# Patient Record
Sex: Female | Born: 1975
Health system: Southern US, Community
[De-identification: ages and names within clinical notes are randomized; demographics above are authoritative.]

## PROBLEM LIST (undated history)

## (undated) DIAGNOSIS — N920 Excessive and frequent menstruation with regular cycle: Secondary | ICD-10-CM

---

## 1998-10-30 ENCOUNTER — Other Ambulatory Visit: Admission: RE | Admit: 1998-10-30 | Discharge: 1998-10-30 | Payer: Self-pay | Admitting: *Deleted

## 1999-11-13 ENCOUNTER — Other Ambulatory Visit: Admission: RE | Admit: 1999-11-13 | Discharge: 1999-11-13 | Payer: Self-pay | Admitting: *Deleted

## 2002-02-22 ENCOUNTER — Other Ambulatory Visit: Admission: RE | Admit: 2002-02-22 | Discharge: 2002-02-22 | Payer: Self-pay | Admitting: Obstetrics and Gynecology

## 2003-03-14 ENCOUNTER — Other Ambulatory Visit: Admission: RE | Admit: 2003-03-14 | Discharge: 2003-03-14 | Payer: Self-pay | Admitting: Obstetrics and Gynecology

## 2003-05-31 ENCOUNTER — Ambulatory Visit (HOSPITAL_COMMUNITY): Admission: RE | Admit: 2003-05-31 | Discharge: 2003-05-31 | Payer: Self-pay | Admitting: Obstetrics and Gynecology

## 2003-10-23 ENCOUNTER — Other Ambulatory Visit: Admission: RE | Admit: 2003-10-23 | Discharge: 2003-10-23 | Payer: Self-pay | Admitting: Obstetrics and Gynecology

## 2003-12-15 ENCOUNTER — Inpatient Hospital Stay (HOSPITAL_COMMUNITY): Admission: AD | Admit: 2003-12-15 | Discharge: 2003-12-15 | Payer: Self-pay | Admitting: Obstetrics & Gynecology

## 2004-01-06 ENCOUNTER — Inpatient Hospital Stay (HOSPITAL_COMMUNITY): Admission: AD | Admit: 2004-01-06 | Discharge: 2004-01-08 | Payer: Self-pay | Admitting: Obstetrics and Gynecology

## 2004-01-06 ENCOUNTER — Encounter (INDEPENDENT_AMBULATORY_CARE_PROVIDER_SITE_OTHER): Payer: Self-pay | Admitting: Specialist

## 2004-02-20 ENCOUNTER — Ambulatory Visit (HOSPITAL_COMMUNITY): Admission: RE | Admit: 2004-02-20 | Discharge: 2004-02-20 | Payer: Self-pay | Admitting: Obstetrics and Gynecology

## 2004-09-06 ENCOUNTER — Inpatient Hospital Stay (HOSPITAL_COMMUNITY): Admission: AD | Admit: 2004-09-06 | Discharge: 2004-09-06 | Payer: Self-pay | Admitting: Obstetrics and Gynecology

## 2004-09-07 ENCOUNTER — Inpatient Hospital Stay (HOSPITAL_COMMUNITY): Admission: AD | Admit: 2004-09-07 | Discharge: 2004-09-07 | Payer: Self-pay | Admitting: Obstetrics and Gynecology

## 2004-09-18 IMAGING — US US INTRAOPERATIVE - NO REPORT
1 series · 5 of 5 positions shown · non-contrast
Comparison: none

[Series 1: us intraoperative - no report · 5 of 5 slices shown]
[im 1/5]
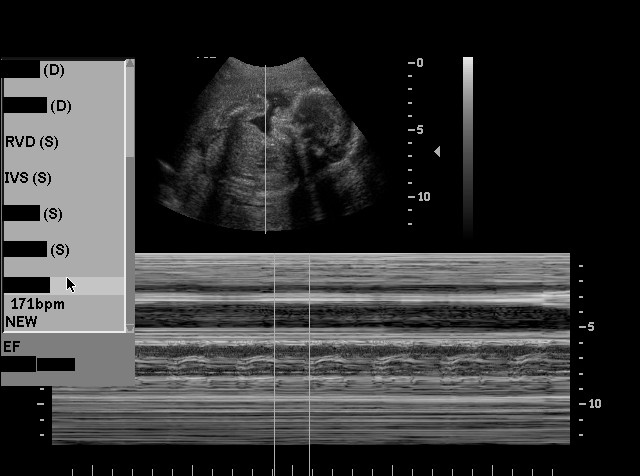
[im 2/5]
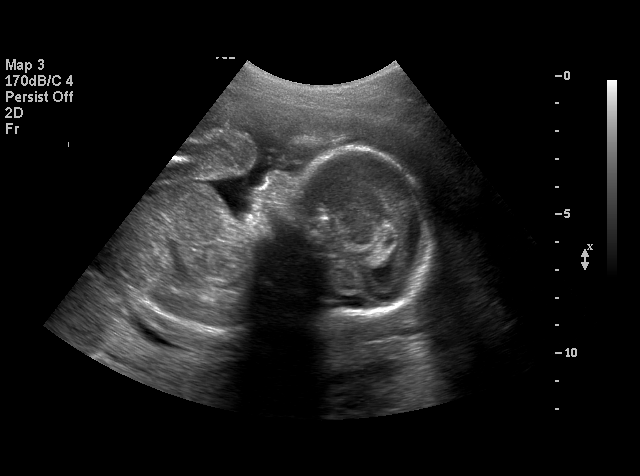
[im 3/5]
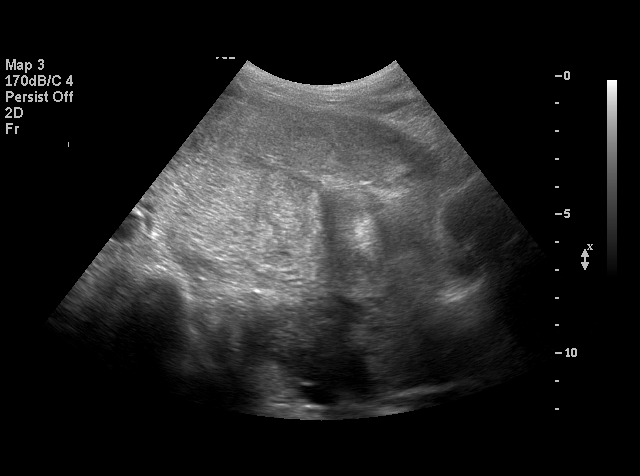
[im 4/5]
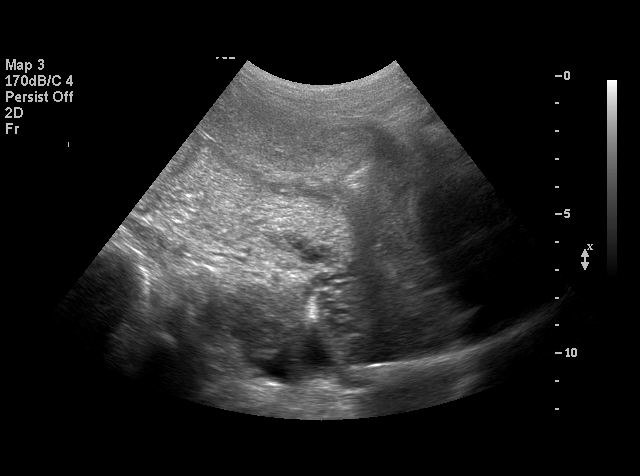
[im 5/5]
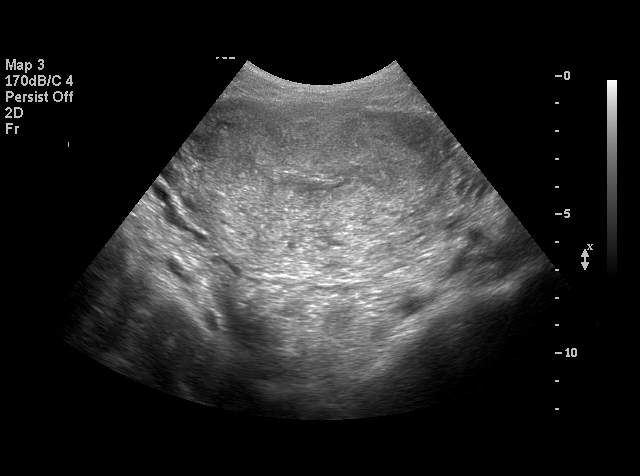

[5 of 5 positions shown; findings below may reference images not displayed]

ULTRASOUND INTRAOPERATIVE
 Ultrasound was utilized in the operating room by the requesting physician.

## 2004-10-13 ENCOUNTER — Inpatient Hospital Stay (HOSPITAL_COMMUNITY): Admission: AD | Admit: 2004-10-13 | Discharge: 2004-10-13 | Payer: Self-pay | Admitting: *Deleted

## 2004-10-20 ENCOUNTER — Inpatient Hospital Stay (HOSPITAL_COMMUNITY): Admission: AD | Admit: 2004-10-20 | Discharge: 2004-10-20 | Payer: Self-pay | Admitting: *Deleted

## 2004-10-21 ENCOUNTER — Inpatient Hospital Stay (HOSPITAL_COMMUNITY): Admission: AD | Admit: 2004-10-21 | Discharge: 2004-10-21 | Payer: Self-pay | Admitting: Obstetrics and Gynecology

## 2004-12-05 ENCOUNTER — Inpatient Hospital Stay (HOSPITAL_COMMUNITY): Admission: AD | Admit: 2004-12-05 | Discharge: 2004-12-07 | Payer: Self-pay | Admitting: Obstetrics and Gynecology

## 2008-11-22 ENCOUNTER — Inpatient Hospital Stay (HOSPITAL_COMMUNITY): Admission: AD | Admit: 2008-11-22 | Discharge: 2008-11-22 | Payer: Self-pay | Admitting: Obstetrics and Gynecology

## 2008-11-23 ENCOUNTER — Inpatient Hospital Stay (HOSPITAL_COMMUNITY): Admission: AD | Admit: 2008-11-23 | Discharge: 2008-11-23 | Payer: Self-pay | Admitting: Obstetrics and Gynecology

## 2008-12-27 ENCOUNTER — Inpatient Hospital Stay (HOSPITAL_COMMUNITY): Admission: AD | Admit: 2008-12-27 | Discharge: 2008-12-29 | Payer: Self-pay | Admitting: Obstetrics

## 2010-10-26 LAB — CBC
HCT: 27.5 % — ABNORMAL LOW (ref 36.0–46.0)
Hemoglobin: 11 g/dL — ABNORMAL LOW (ref 12.0–15.0)
Hemoglobin: 9.8 g/dL — ABNORMAL LOW (ref 12.0–15.0)
MCHC: 34.9 g/dL (ref 30.0–36.0)
Platelets: 193 10*3/uL (ref 150–400)
RBC: 2.99 MIL/uL — ABNORMAL LOW (ref 3.87–5.11)
RBC: 3.39 MIL/uL — ABNORMAL LOW (ref 3.87–5.11)
RDW: 13.2 % (ref 11.5–15.5)
RDW: 13.4 % (ref 11.5–15.5)
WBC: 14.5 10*3/uL — ABNORMAL HIGH (ref 4.0–10.5)
WBC: 14.9 10*3/uL — ABNORMAL HIGH (ref 4.0–10.5)

## 2010-10-26 LAB — CCBB MATERNAL DONOR DRAW

## 2010-12-01 NOTE — H&P (Signed)
[name], [name]                  ACCOUNT NO.:  [id number]   MEDICAL RECORD NO.:  [id number]          PATIENT TYPE:  INP   LOCATION:  9143                          FACILITY:  [facility]   PHYSICIAN:  Lenoard Aden, M.D.DATE OF BIRTH:  1976-05-23   DATE OF ADMISSION:  12/27/2008  DATE OF DISCHARGE:                              HISTORY & PHYSICAL   CHIEF COMPLAINT:  Labor.   A 35 year old white female G4, P1, history of vaginal delivery x1 who  presents at 36 plus weeks in active labor.   She is a nonsmoker and nondrinker.  She denies domestic or physical  violence.   MEDICATIONS:  Prenatal vitamins and Valtrex.   She has no known drug allergies.   She has family history of depression, thyroid disease, stomach cancer,  migraine headaches, and heart disease.  She has a history of a multiple  gestation of triples reduced to twins with subsequent loss at 20 weeks  and a history of an 8 pound 2 ounce vaginal delivery.   PRENATAL COURSE:  Today is uncomplicated.  No herpes lesions noted.   PHYSICAL EXAMINATION:  GENERAL:  She is a well-developed, well-nourished  white female in no acute distress.  HEENT:  Normal.  LUNGS:  Clear.  HEART:  Regular rate and rhythm.  ABDOMEN:  Soft, gravida, and nontender.  Estimated fetal weight 6-1/2 to  7 pounds.  Cervix is 5 cm, 80% vertex, 0 station.  EXTREMITIES:  There are no cords.  NEUROLOGIC:  Nonfocal.  SKIN:  Intact.   IMPRESSION:  A 36 and 4/7th week intrauterine pregnancy, in active  labor.   PLAN:  Anticipated attempts of vaginal delivery, history of HSV on  Valtrex, no lesions noted.      Lenoard Aden, M.D.  Electronically Signed     RJT/MEDQ  D:  12/27/2008  T:  12/28/2008  Job:  [id number]

## 2010-12-04 NOTE — Consult Note (Signed)
NAMECECILLIA, [name]                  ACCOUNT NO.:  [id number]   MEDICAL RECORD NO.:  [id number]          PATIENT TYPE:  MAT   LOCATION:  MATC                          [facility]ILITY[facility]   PHYSICIAN:  Lenoard Aden, M.D.DATE OF BIRTH:  1975-09-29   DATE OF CONSULTATION:  DATE OF DISCHARGE:                                   CONSULTATION   CHIEF COMPLAINT:  Rule out preterm labor.   HISTORY OF PRESENT ILLNESS:  The patient is a 35 year old white female, G3,  P0-1-1-0, who presents at [redacted] weeks gestation with contractions and cervical  change.   ALLERGIES:  She has no known drug allergies.   MEDICATIONS:  Prenatal vitamins.   OBSTETRIC HISTORY:  She has a history of an SAB in 2004.  History of twin  preterm delivery, unexplained, at 21 weeks in 2005.   PAST MEDICAL HISTORY:  She has a rare history of HSV.  She is GBS positive.   FAMILY HISTORY:  She has a family history of heart disease, thyroid disease,  lung cancer.   SURGICAL HISTORY:  1.  D&C.  2.  Tonsillectomy.  3.  Wisdom tooth removal.   PRENATAL LABORATORY DATA:  Blood type of A positive.  HIV antibody negative.  Rubella immune.  Hepatitis and HIV negative.   PHYSICAL EXAMINATION:  GENERAL:  She is a well-developed, well-nourished,  white female in no acute distress.  HEENT:  Normal.  LUNGS:  Clear.  HEART:  Regular rate and rhythm.  ABDOMEN:  Soft, gravid, nontender.  PELVIC:  Cervical exam is deferred.  EXTREMITIES:  No cords.  NEUROLOGIC:  Nonfocal.  SKIN:  Intact.   NST reveals a reassuring strip for 26 weeks.  No contractions noted.   IMPRESSION:  1.  Twenty-six week OB.  2.  No evidence of preterm cervical change, with a negative FFN.  3.  GBS positive.   PLAN:  1.  Discharge home.  2.  Procardia 10 mg p.o. q.4-6h. p.r.n. contractions.  3.  Discontinue work at this time.  4.  Follow up in the office in one week.  5.  Strict preterm labor warnings given.      RJT/MEDQ  D:  09/07/2004  T:   09/07/2004  Job:  [id number]   cc:   Ma Hillock

## 2010-12-04 NOTE — H&P (Signed)
[name], [name]                              ACCOUNT NO.:  [id number]   MEDICAL RECORD NO.:  [id number]                   PATIENT TYPE:  INP   LOCATION:  9371                                 [facility]ILITY[facility]   PHYSICIAN:  Richardean Sale, M.D.                DATE OF BIRTH:  1976-05-09   DATE OF ADMISSION:  01/06/2004  DATE OF DISCHARGE:                                HISTORY & PHYSICAL   DATE OF DELIVERY:  January 06, 2004   ADMITTING DIAGNOSIS:  Twenty-one-week intrauterine pregnancy, known twin  gestation, in labor.   CHIEF COMPLAINT:  Abdominal pain.   HISTORY OF PRESENT ILLNESS:  This is a 35 year old gravida 2 para 0-0-1-0  white female who presented to the office the morning of January 06, 2004  complaining of abdominal pain and pressure.  The patient has a known twin  intrauterine gestation which was previously triplets originally diagnosed at  approximately [redacted] weeks gestation.  At that time she had three viable  intrauterine pregnancies.  The patient had been having intermittent spotting  after her first obstetric visit at 10 weeks.  She presented for ultrasound  at 10 weeks and at that time was found to have only two viable pregnancies  measuring 10 weeks.  The third gestation was only 7 weeks with no cardiac  activity.  Since that time the patient has had intermittent spotting.  Ultrasounds have been performed to assess for growth.  Growth of A and B  have been appropriate.  At 16 weeks the patient had a cervical length of 3.5  cm with concordant growth of baby A and B, normal amniotic fluid volume for  both.  At approximately [redacted] weeks gestation the patient was evaluated at  [facility], had an ultrasound performed which revealed low placental  insertion approximately 1.4 cm from the cervical os.  There was also  probable retained products of conception over the cervix likely from the  lost third gestation.  The patient continued to have intermittent spotting.  Cervical  exams at each visit revealed the cervix was 2.5 to 3 cm long.  Ultrasounds confirmed a cervical length of approximately 3.5 cm on multiple  scans.  At [redacted] weeks gestation the patient presented for a comprehensive  ultrasound.  She had had, again, intermittent vaginal bleeding but no  contractions, denied any loss of fluid, and was reporting flutters.  Ultrasound on January 01, 2004 revealed appropriately-grown fetuses.  However,  there was new onset of oligohydramnios on baby A which was not seen on prior  ultrasound.  Anatomy of baby A was very limited secondary to the lack of  fluid.  A transvaginal cervical length was performed which was 3.6 cm and  there was no dilation or funneling with fundal pressure.  The patient  underwent evaluation with a speculum exam.  There was no fluid coming from  the cervix.  There was a small amount of dark blood in the vault.  Nitrazine  and fern test were negative.  At that point the patient was referred to Dr.  Particia Nearing for consultation at the [facility] for  evaluation of a new onset of oligohydramnios.  The patient was seen by Dr.  Sherrie George who confirmed the severe oligohydramnios of baby A.  The patient  again declined any loss of clear fluid, just complaining of a little bit of  vaginal spotting intermittently.  The patient was offered an amniocentesis  with indigo carmine dye test to evaluate for ruptured membranes but the  patient declined.  She elected to manage this expectantly at home on bedrest  and to evaluate for signs of infection in the event that rupture of  membranes had occurred.  The patient began having further vaginal bleeding  and some lower abdominal discomfort on June 18.  She stated her fever was  highest of 100.0 while at home.  The night of June 19 she was having what  she thought were gas pains and denied any contractions.  On the morning of  June 20 her pain became more significant.  She was complaining of   significant pressure as well as continued vaginal spotting.  She was  evaluated in the office on the morning of June 20 and found to be completely  dilated in the breech presentation.  The patient was subsequently  immediately transferred to the maternity admissions unit at [facility].  Ultrasound was performed which revealed that baby A was breech  and in the vaginal vault.  Baby B had normal cardiac activity, normal  amniotic fluid, and was in the fundus.  At this point the patient continued  to have pressure.  She delivered by spontaneous vaginal delivery a female  fetus consistent with [redacted] weeks gestation.  The cord was clamped and cut.  There was cardiac activity present at the time of delivery but given the  extreme prematurity, resuscitation was not performed.  The patient received  approximately 10 mL of 1% lidocaine in the perineum for anesthesia during  the delivery process.  Given that she was complete when she arrived at  maternity admissions epidural was not able to be placed.  While the patient  was in maternity admissions she was started on ampicillin and gentamycin for  possible chorioamnionitis.  She was afebrile on admission.  CBC was  performed and came back with a white count of 21,000.  The patient's abdomen  was soft, nontender to palpation, and nondistended.  The patient denied any  abdominal pain, just continued to complain of pressure when she had  contractions.  After the vaginal delivery of the first infant was completed,  the umbilical cord was clamped and the placenta was examined and was very  high in the pelvis with the cervix dilated to approximately 7 cm.  Coagulation studies were drawn which were within normal limits.   PAST MEDICAL HISTORY:  None.   PAST SURGICAL HISTORY:  D&E for missed AB in 2004.   OBSTETRICAL HISTORY:  First pregnancy missed AB first trimester.  Second pregnancy current, conceived on 50 mg of Clomid.  Initially triple  pregnancy  then spontaneously reduced to twins.   GYNECOLOGICAL HISTORY:  Positive history of HSV, last outbreak greater than  1 year ago.  No symptoms at present.  Denies abnormal Pap smears.   SOCIAL HISTORY:  She is married.  Denies tobacco, alcohol, or drugs.  FAMILY HISTORY:  No known bleeding disorders.  Otherwise, noncontributory.   PHYSICAL EXAMINATION:  Please see above HPI for additional exam.  VITAL SIGNS:  On admission the patient was afebrile with vital signs stable.  HEART:  Regular rate and rhythm.  LUNGS:  Clear.  ABDOMEN:  Soft, nontender to palpation.  Fundus at the umbilicus.  EXTREMITIES:  Positive evidence of recent poison oak infection with mild  excoriations.  Extremities otherwise nontender, no edema.  VAGINAL:  Perineum was intact after delivery of baby A.   LABORATORY STUDIES ON ADMISSION:  CBC as above with white count of 21.  Coagulation studies within normal limits.   ASSESSMENT:  A 35 year old gravida 2 para 0-0-1-0 white female status post  spontaneous vaginal delivery of twin A with continued contractions.  Currently viable intrauterine twin B.   PLAN:  1. Discussed with the patient and husband options for management.  Explained     to them that given her persistent contractions it is likely that she will     be in labor of the second twin.  Reviewed with them that if labor and     delivery do occur that at 21+ weeks gestation the baby is not viable and     resuscitation will not be performed.  At this point the patient is     afebrile and has no abdominal tenderness.  Discussed with the patient     option of augmenting labor with Pitocin to expedite delivery as she is at     substantial risk for developing chorioamnionitis which could lead to     sepsis and possibly her death or compromise her future fertility.     Reviewed with the patient and husband that there are some rare reported     cases of delayed delivery of the second twin until  viability but given     that she is contracting and is at risk for infection, any attempts to     stop her labor are contraindicated at this time.  Offered augmentation     with Pitocin to expedite delivery but the patient and her husband would     like to wait until labor ensues.  Explained in detail to the patient that     if she develops a fever or abdominal tenderness that the delivery would     need to be expedited by augmentation with the Pitocin.  2. Will transfer to AICU, continue ampicillin and gentamycin and clindamycin     for triple antibiotic coverage.  Monitor for fever or abdominal     tenderness.  3. Check serial coags and CBC and monitor for signs of DIC.   They voiced understanding that by not inducing labor at this time, if  infection is present it could jeopardize the patient's health and her future  fertility.                                               Richardean Sale, M.D.   JW/MEDQ  D:  01/06/2004  T:  01/07/2004  Job:  [id number]

## 2010-12-04 NOTE — Discharge Summary (Signed)
NAMEEBONEY, CLAYBROOK                              ACCOUNT NO.:  [id number]   MEDICAL RECORD NO.:  [id number]                   PATIENT TYPE:  INP   LOCATION:  9313                                 [facility]ACILITY[facility]  [facility]   PHYSICIAN:  Richardean Sale, M.D.                DATE OF BIRTH:  Aug 31, 1975   DATE OF ADMISSION:  01/06/2004  DATE OF DISCHARGE:  01/08/2004                                 DISCHARGE SUMMARY   ADDENDUM:   LABORATORY STUDIES:  On postop day #1 white blood cell count of 21,  hemoglobin of 9.3, hematocrit 26.9, platelets 265, fibrinogen 667, INR 1.1,  PTT 25.  Sodium 131, chloride 100, potassium 3.3, carbon dioxide 19, alk  phos 75, BUN 5, creatinine 0.5, SGOT 18, SGPT 12, calcium 8.4.                                               Richardean Sale, M.D.    JW/MEDQ  D:  01/27/2004  T:  01/27/2004  Job:  [id number]

## 2010-12-04 NOTE — H&P (Signed)
[name], [name]                  ACCOUNT NO.:  [id number]   MEDICAL RECORD NO.:  [id number]          PATIENT TYPE:  INP   LOCATION:  9168                          [facility]   PHYSICIAN:  Richardean Sale, M.D.   DATE OF BIRTH:  Sep 27, 1975   DATE OF ADMISSION:  12/05/2004  DATE OF DISCHARGE:                                HISTORY & PHYSICAL   ADMITTING DIAGNOSIS:  A 38+ week intrauterine pregnancy in active labor.   HISTORY OF PRESENT ILLNESS:  This is a 35 year old, gravida 3, para 0-1-1-0,  white female at 49-5/7th weeks' gestation with a due date of Dec 14, 2004,  by LMP consistent with ultrasound, who noticed onset of contractions at  approximately 11 a.m. on Dec 05, 2004.  She reports good fetal movement.  Denies loss of fluid or vaginal bleeding.  Contractions occurring  approximately every three minutes.  Prenatal care has been at [facility] with Dr. Richardean Sale as primary attending.  Pregnancy has been  complicated by a preterm cervical change for which the patient was on bed  rest and received betamethasone.  The patient also has a remote history of  HSV and has been on Valtrex for suppression since 36 weeks.  She denies any  prodromal symptoms, no ulcerations, and no lesions.   PAST OBSTETRIC HISTORY:  1.  Gravida 1, first trimester spontaneous loss, Clomid pregnancy treated      with D&C.  2.  Second pregnancy initially triplet gestation conceived with Clomid with      spontaneous loss of the first triplet in the first trimester followed by      placental abruption and chorioamnionitis at 21 weeks with loss of both      twins.  3.  Spontaneous conception.   GYNECOLOGIC HISTORY:  1.  Remote history of HSV.  2.  No history of abnormal Pap smears.  3.  Positive for infertility.   PAST MEDICAL HISTORY:  No cardiopulmonary disease and no history of any  prior hospital admission with the exception of her pregnancies.   PAST SURGICAL HISTORY:  1.  D&C x  1.  2.  Tonsillectomy.  3.  Wisdom teeth.   FAMILY HISTORY:  No known birth defects, congenital anomalies, babies born  with any cystic fibrosis, Down syndrome, spina bifida.  Significant for Mom  with anemia and hypothyroidism.  Maternal grandfather with coronary artery  disease.  Paternal grandfather with lung cancer.   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   MEDICATIONS:  1.  The patient was on baby aspirin daily, discontinued at 34 weeks.  2.  Valtrex 500 mg p.o. daily.  3.  Prenatal vitamins one tablet daily.   ALLERGIES:  No known drug allergies.   PHYSICAL EXAMINATION:  VITAL SIGNS:  Blood pressure is 130/99.  She is  afebrile.  Weight 136 pounds.  GENERAL:  She is a well-developed, well-nourished, white female who is in no  acute distress.  HEART:  Regular rate and rhythm.  LUNGS:  Clear to  auscultation bilaterally.  ABDOMEN:  Gravid.  Soft in between contractions.  Contractions palpate  moderate.  EXTREMITIES:  Trace edema of the feet bilaterally.  No cyanosis or clubbing.  Nontender.  PELVIC:  Cervix is 5-cm, 100%, 0 station.  Membranes intact.   A fetal heart rate tracing is in the 140s and reactive.  Tocometer tracing  shows contractions every 2-3 minutes.   PRENATAL LABS:  Group B beta strep positive.  Blood type is A positive.  Antibody screen  negative.  RPR nonreactive.  Rubella immune.  Hepatitis B  surface antigen nonreactive.  HIV nonreactive.  Triple test within normal  limits.  One hour Glucola 118.   ASSESSMENT:  A 35 year old gravida 3, para 0-1-1-0, white female at 52 plus  weeks' gestation in active labor.   PLAN:  1.  Admit to labor and delivery.  2.  Check CBC and hold clot admission.  3.  We will administer penicillin for Group B beta strep positive.  4.  The patient may have epidural for pain relief.  5.  Amniotomy and low-dose Pitocin if needed.  6.  Continuous fetal monitoring.   7.  Anticipate attempt at vaginal delivery.      JW/MEDQ  D:  12/05/2004  T:  12/05/2004  Job:  [id number]

## 2010-12-04 NOTE — H&P (Signed)
NAMEKIYOKO, MCGUIRT                  ACCOUNT NO.:  [id number]   MEDICAL RECORD NO.:  [id number]          PATIENT TYPE:  MAT   LOCATION:  MATC                          [facility]   PHYSICIAN:  Lenoard Aden, M.D.DATE OF BIRTH:  1976-02-23   DATE OF ADMISSION:  DATE OF DISCHARGE:                              HISTORY & PHYSICAL   CHIEF COMPLAINT:  Preterm labor.   A 35 year old G3, P1-0-1-1 at 61 weeks' gestation, who presents with a  preterm cervical change and a history of term delivery for betamethasone  #2 and fetal fibronectin.   MEDICATIONS:  Prenatal vitamins.   FAMILY HISTORY:  She had a family history of lung and stomach cancer,  depression, heart disease, and thyroid disease.   ALLERGIES:  No known drug allergies.   PERSONAL HISTORY:  Assisted vaginal delivery of 8-pound and 2-ounce  female in 2006 and 2 SABs previously.   SOCIAL HISTORY:  She is a nonsmoker and nondrinker.  She denies domestic  or physical violence.   PRENATAL COURSE:  Today complicated by preterm cervical change.   PHYSICAL EXAMINATION:  GENERAL:  She is a well-developed and well-  nourished white female in no acute distress.  HEENT: Normal.  LUNGS:  Clear.  HEART:  Regular   rhythm.  ABDOMEN:  Soft, gravid, and nontender.  No CVA tenderness.  EXTREMITIES:  No cords.  NEUROLOGIC:  Nonfocal.  SKIN:  Intact.  PELVIC:  Deferred.   NST is reactive.  No regular contractions noted.  Fetal fibronectin is  performed in a standard fashion and NST is reactive with no  contractions.   IMPRESSION:  1. Preterm cervical change.  2. No evidence of preterm labor.   PLAN:  Complete betamethasone series.  Discharged home.  Preterm labor  warnings were given and check FFN.      Lenoard Aden, M.D.  Electronically Signed     RJT/MEDQ  D:  11/25/2008  T:  11/25/2008  Job:  [id number]

## 2010-12-04 NOTE — Op Note (Signed)
[name], [name]                              ACCOUNT NO.:  [id number]   MEDICAL RECORD NO.:  [id number]                   PATIENT TYPE:  INP   LOCATION:  9371                                 [facility]   PHYSICIAN:  Lenoard Aden, M.D.             DATE OF BIRTH:  April 27, 1976   DATE OF PROCEDURE:  01/06/2004  DATE OF DISCHARGE:                                 OPERATIVE REPORT   PREOPERATIVE DIAGNOSES:  Twin intrauterine pregnancy with inevitable  abortion, previous spontaneous passage of twin B with maternal fever and  questionable septic abortion.   POSTOPERATIVE DIAGNOSES:  Twin intrauterine pregnancy with inevitable  abortion, previous spontaneous passage of twin B with maternal fever and  questionable septic abortion.   PROCEDURE:  Placental extraction of twin B and late trimester extraction of  twin A.   SURGEON:  Lenoard Aden, M.D.   ASSISTANT:  Richardean Sale, M.D. and Chester Holstein. Earlene Plater, M.D.   Intraoperative ultrasound performed.   ANESTHESIA:  General.   ESTIMATED BLOOD LOSS:  300 mL.   Patient to recovery in good condition on triple antibiotics.   COUNTS:  Correct.   DRAINS:  Foley catheter.   DESCRIPTION OF PROCEDURE:  After being apprised by Richardean Sale, M.D. of  the risks of anesthesia, infection, bleeding and continuation of the  pregnancy to include septic shock and possible maternal and fetal demise,  the patient is consented and brought to the operating room where she is  administered general anesthetic without complications, prepped and draped in  the usual sterile fashion and Foley catheter previously placed. A weighted  speculum placed.  Cord of twin B is extracted using sponge forceps in total  and sent to pathology. Amniotomy is performed using a #14 suction curette  for turbid appearing fluid. At this time, extraction of fetus B is made  intact using Sopher forceps delivered from a breech position and nonviable  status noted. The  placenta is also extracted.  Ultrasound reveals emptiness  of the uterine cavity, minimal bleeding is noted. Pitocin is applied to a  dilute IV solution. The patient is awakened and transferred to recovery in  guarded condition.                                               Lenoard Aden, M.D.    RJT/MEDQ  D:  01/06/2004  T:  01/07/2004  Job:  [id number]

## 2010-12-04 NOTE — Discharge Summary (Signed)
[name], [name]                              ACCOUNT NO.:  [id number]   MEDICAL RECORD NO.:  [id number]                   PATIENT TYPE:  INP   LOCATION:  9313                                 FACILITY:  [facility]   PHYSICIAN:  Richardean Sale, M.D.                DATE OF BIRTH:  06-12-1976   DATE OF ADMISSION:  01/06/2004  DATE OF DISCHARGE:  01/08/2004                                 DISCHARGE SUMMARY   ADMISSION DIAGNOSIS:  Twenty-one week twin intrauterine pregnancy, in active  labor.   HISTORY OF PRESENT ILLNESS:  This is a 35 year old gravida 2, para 0-0-1-0,  white female, now gravida 2, para 0-0-3-0, white female who presented at 81  weeks' gestation with a known twin intrauterine pregnancy on January 06, 2004,  complaining of significant abdominal pain and pressure.  She originally  presented to the office, where she was examined and found to be completely  dilated with twin A in the breech presentation.  She was subsequently  rapidly transferred to maternity admissions at [facility], where she  delivered by spontaneous vaginal delivery a premature infant consistent with  21 weeks' gestation, and the baby died shortly after birth due to severe  prematurity.   On admission the patient was afebrile with stable vital signs and no  abdominal tenderness but continued to contract after delivery of the first  twin.  Placenta of twin A was still intact and unable to be delivered at  that time.  The patient was started on triple antibiotic therapy, was  monitored for fever and abdominal tenderness and continued to contract.  She  was offered augmentation with Pitocin, but the patient declined as she  wished to await onset of active labor if at all possible.  The risk of  waiting to deliver was reviewed with the patient, specifically the risk of  chorioamnionitis with fever, abdominal pain, and possibly threatened the  patient's health and her future fertility.  The patient was monitored  in the  AICU, continued on triple antibiotics, and in the afternoon of June 20 she  developed fever of 101.8.  Blood cultures were drawn.  Vaginal examination  was performed.  The patient was still approximately 7 cm dilated with the  placenta of twin A still intact and unable to be delivered with maternal  expulsive efforts or gentle traction on the umbilical cord.  Care was taken  not to avulse the cord.  At that time the patient developed tachycardia with  a heart rate in the 140s and her temperature increased rapidly to 102.7.  Given that she was remote from delivery of the second twin despite  persistent contractions and Pitocin had already been started, the patient  was counseled on the need to intervene with an operative delivery due to the  possibility of sepsis.  Explained to the patient that given her high fever  and heart  rate, she could become substantially ill, which could threaten her  life and result in her being unable to have children in the future.  The  patient agreed to proceed with operative intervention.  She was counseled on  the possible procedure of D&E of the second baby, and it was explained to  the patient that the baby would likely die during the process.  The patient  was taken to the operating room.  She was given general anesthesia and  underwent vaginal breech extraction and D&E to deliver the fetus and the two  placentas.  After delivery the patient was awakened from anesthesia and  taken to the recovery room.  She immediately became afebrile and her vital  signs stabilized after delivery.  She was monitored in the AICU and  continued on triple antibiotic therapy, and she remained afebrile for over  36 hours.  On postoperative day #2 she was voiding, passing flatus,  ambulating, tolerating a regular diet, was not requiring any pain  medications, denied any complaints of fever or chills, and desired to be  discharged to home.  She was discharged to home in  stable condition on  postoperative day #2.   DISPOSITION:  To home.   CONDITION:  Stable.   MEDICATIONS:  1. Prenatal vitamin one p.o. daily.  2. Ferrous sulfate 325 mg p.o. daily.  3. Stool softener as needed.  4. Ibuprofen 800 mg p.o. q.8h. p.r.n. pain.   DISCHARGE INSTRUCTIONS:  Patient to call for any fever greater than 100.4,  heavy vaginal bleeding, abdominal pain not relieved with ibuprofen, or any  other unusual symptoms.  She is to follow up in the office in three weeks.  At that time we will discuss further plans of management, as the patient and  her husband do desire to attempt pregnancy again.  Will draw an  antiphospholipid antibody panel and schedule a hysterosalpingogram to  evaluate the uterine cavity.                                               Richardean Sale, M.D.    JW/MEDQ  D:  01/08/2004  T:  01/10/2004  Job:  [id number]

## 2018-11-10 DIAGNOSIS — Z1151 Encounter for screening for human papillomavirus (HPV): Secondary | ICD-10-CM | POA: Diagnosis not present

## 2018-11-10 DIAGNOSIS — N920 Excessive and frequent menstruation with regular cycle: Secondary | ICD-10-CM | POA: Diagnosis not present

## 2018-11-10 DIAGNOSIS — N841 Polyp of cervix uteri: Secondary | ICD-10-CM | POA: Diagnosis not present

## 2018-11-10 DIAGNOSIS — Z124 Encounter for screening for malignant neoplasm of cervix: Secondary | ICD-10-CM | POA: Diagnosis not present

## 2018-11-10 DIAGNOSIS — Z6822 Body mass index (BMI) 22.0-22.9, adult: Secondary | ICD-10-CM | POA: Diagnosis not present

## 2018-11-10 DIAGNOSIS — Z118 Encounter for screening for other infectious and parasitic diseases: Secondary | ICD-10-CM | POA: Diagnosis not present

## 2018-11-10 DIAGNOSIS — Z01419 Encounter for gynecological examination (general) (routine) without abnormal findings: Secondary | ICD-10-CM | POA: Diagnosis not present

## 2018-11-10 DIAGNOSIS — N840 Polyp of corpus uteri: Secondary | ICD-10-CM | POA: Diagnosis not present

## 2018-12-01 DIAGNOSIS — Z0000 Encounter for general adult medical examination without abnormal findings: Secondary | ICD-10-CM | POA: Diagnosis not present

## 2018-12-01 DIAGNOSIS — Z1231 Encounter for screening mammogram for malignant neoplasm of breast: Secondary | ICD-10-CM | POA: Diagnosis not present

## 2018-12-01 DIAGNOSIS — N920 Excessive and frequent menstruation with regular cycle: Secondary | ICD-10-CM | POA: Diagnosis not present

## 2018-12-01 DIAGNOSIS — Z13220 Encounter for screening for lipoid disorders: Secondary | ICD-10-CM | POA: Diagnosis not present

## 2018-12-21 ENCOUNTER — Other Ambulatory Visit: Payer: Self-pay | Admitting: Obstetrics and Gynecology

## 2019-01-04 ENCOUNTER — Encounter (HOSPITAL_COMMUNITY)
Admission: RE | Admit: 2019-01-04 | Discharge: 2019-01-04 | Disposition: A | Discharge status: Home / Self Care | Payer: BC Managed Care – PPO | Source: Ambulatory Visit | Attending: Obstetrics and Gynecology | Admitting: Obstetrics and Gynecology

## 2019-01-04 ENCOUNTER — Other Ambulatory Visit (HOSPITAL_COMMUNITY)
Admission: RE | Admit: 2019-01-04 | Discharge: 2019-01-04 | Disposition: A | Discharge status: Home / Self Care | Payer: BC Managed Care – PPO | Source: Ambulatory Visit | Attending: Obstetrics and Gynecology | Admitting: Obstetrics and Gynecology

## 2019-01-04 ENCOUNTER — Other Ambulatory Visit: Payer: Self-pay

## 2019-01-04 DIAGNOSIS — Z1159 Encounter for screening for other viral diseases: Secondary | ICD-10-CM | POA: Insufficient documentation

## 2019-01-04 DIAGNOSIS — N840 Polyp of corpus uteri: Secondary | ICD-10-CM | POA: Diagnosis not present

## 2019-01-04 DIAGNOSIS — N920 Excessive and frequent menstruation with regular cycle: Secondary | ICD-10-CM | POA: Diagnosis not present

## 2019-01-04 LAB — CBC
HCT: 37.9 % (ref 36.0–46.0)
Hemoglobin: 12.9 g/dL (ref 12.0–15.0)
MCH: 30.1 pg (ref 26.0–34.0)
MCHC: 34 g/dL (ref 30.0–36.0)
MCV: 88.6 fL (ref 80.0–100.0)
Platelets: 313 10*3/uL (ref 150–400)
RBC: 4.28 MIL/uL (ref 3.87–5.11)
RDW: 12.3 % (ref 11.5–15.5)
WBC: 8.2 10*3/uL (ref 4.0–10.5)
nRBC: 0 % (ref 0.0–0.2)

## 2019-01-04 LAB — HCG, SERUM, QUALITATIVE: Preg, Serum: NEGATIVE

## 2019-01-05 ENCOUNTER — Encounter (HOSPITAL_BASED_OUTPATIENT_CLINIC_OR_DEPARTMENT_OTHER): Payer: Self-pay | Admitting: *Deleted

## 2019-01-05 ENCOUNTER — Other Ambulatory Visit: Payer: Self-pay

## 2019-01-05 LAB — SARS CORONAVIRUS 2 (TAT 6-24 HRS): SARS Coronavirus 2: NEGATIVE

## 2019-01-05 NOTE — Progress Notes (Signed)
Spoke w/ pt via phone for pre-op interview.  Npo after mn.  Arrive at 0730.  CBC and Serum preg results 01-04-2019 in chart and epic.  Pt had covid test done 01-04-2019.

## 2019-01-05 NOTE — Progress Notes (Signed)

## 2019-01-08 ENCOUNTER — Ambulatory Visit (HOSPITAL_BASED_OUTPATIENT_CLINIC_OR_DEPARTMENT_OTHER): Payer: BC Managed Care – PPO | Admitting: Physician Assistant

## 2019-01-08 ENCOUNTER — Encounter (HOSPITAL_BASED_OUTPATIENT_CLINIC_OR_DEPARTMENT_OTHER): Payer: Self-pay | Admitting: *Deleted

## 2019-01-08 ENCOUNTER — Ambulatory Visit (HOSPITAL_BASED_OUTPATIENT_CLINIC_OR_DEPARTMENT_OTHER): Payer: BC Managed Care – PPO | Admitting: Anesthesiology

## 2019-01-08 ENCOUNTER — Ambulatory Visit (HOSPITAL_BASED_OUTPATIENT_CLINIC_OR_DEPARTMENT_OTHER)
Admission: RE | Admit: 2019-01-08 | Discharge: 2019-01-08 | Disposition: A | Discharge status: Home / Self Care | Payer: BC Managed Care – PPO | Attending: Obstetrics and Gynecology | Admitting: Obstetrics and Gynecology

## 2019-01-08 ENCOUNTER — Other Ambulatory Visit: Payer: Self-pay

## 2019-01-08 ENCOUNTER — Encounter (HOSPITAL_BASED_OUTPATIENT_CLINIC_OR_DEPARTMENT_OTHER)
Admission: RE | Disposition: A | Discharge status: Home / Self Care | Payer: Self-pay | Source: Home / Self Care | Attending: Obstetrics and Gynecology

## 2019-01-08 DIAGNOSIS — N840 Polyp of corpus uteri: Secondary | ICD-10-CM | POA: Insufficient documentation

## 2019-01-08 DIAGNOSIS — N920 Excessive and frequent menstruation with regular cycle: Secondary | ICD-10-CM | POA: Insufficient documentation

## 2019-01-08 HISTORY — PX: DILITATION & CURRETTAGE/HYSTROSCOPY WITH NOVASURE ABLATION: SHX5568

## 2019-01-08 HISTORY — DX: Excessive and frequent menstruation with regular cycle: N92.0

## 2019-01-08 SURGERY — DILATATION & CURETTAGE/HYSTEROSCOPY WITH NOVASURE ABLATION
Anesthesia: General | Site: Uterus

## 2019-01-08 MED ORDER — OXYCODONE HCL 5 MG/5ML PO SOLN
5.0000 mg | Freq: Once | ORAL | Status: AC | PRN
  Filled 2019-01-08: qty 5

## 2019-01-08 MED ORDER — ONDANSETRON HCL 4 MG/2ML IJ SOLN
INTRAMUSCULAR | Status: AC
  Filled 2019-01-08: qty 2

## 2019-01-08 MED ORDER — DEXAMETHASONE SODIUM PHOSPHATE 10 MG/ML IJ SOLN
INTRAMUSCULAR | Status: DC | PRN
  Administered 2019-01-08: 5 mg via INTRAVENOUS

## 2019-01-08 MED ORDER — CEFAZOLIN SODIUM-DEXTROSE 2-4 GM/100ML-% IV SOLN
2.0000 g | INTRAVENOUS | Status: AC
  Administered 2019-01-08: 2 g via INTRAVENOUS
  Filled 2019-01-08: qty 100

## 2019-01-08 MED ORDER — ONDANSETRON HCL 4 MG/2ML IJ SOLN
INTRAMUSCULAR | Status: DC | PRN
  Administered 2019-01-08: 4 mg via INTRAVENOUS

## 2019-01-08 MED ORDER — LIDOCAINE 2% (20 MG/ML) 5 ML SYRINGE
INTRAMUSCULAR | Status: DC | PRN
  Administered 2019-01-08: 60 mg via INTRAVENOUS

## 2019-01-08 MED ORDER — OXYCODONE HCL 5 MG PO TABS
ORAL_TABLET | ORAL | Status: AC
  Filled 2019-01-08: qty 1

## 2019-01-08 MED ORDER — KETOROLAC TROMETHAMINE 30 MG/ML IJ SOLN
INTRAMUSCULAR | Status: AC
  Filled 2019-01-08: qty 1

## 2019-01-08 MED ORDER — TRAMADOL HCL 50 MG PO TABS: 50.0000 mg | ORAL_TABLET | Freq: Four times a day (QID) | ORAL | 0 refills | Status: AC | PRN

## 2019-01-08 MED ORDER — LACTATED RINGERS IV SOLN
INTRAVENOUS | Status: DC
  Administered 2019-01-08: 08:00:00 via INTRAVENOUS
  Filled 2019-01-08: qty 1000

## 2019-01-08 MED ORDER — MIDAZOLAM HCL 2 MG/2ML IJ SOLN
INTRAMUSCULAR | Status: AC
  Filled 2019-01-08: qty 2

## 2019-01-08 MED ORDER — ONDANSETRON HCL 4 MG/2ML IJ SOLN
4.0000 mg | Freq: Once | INTRAMUSCULAR | Status: DC | PRN
  Filled 2019-01-08: qty 2

## 2019-01-08 MED ORDER — SODIUM CHLORIDE (PF) 0.9 % IJ SOLN
INTRAMUSCULAR | Status: DC | PRN
  Administered 2019-01-08: 50 mL

## 2019-01-08 MED ORDER — ARTIFICIAL TEARS OPHTHALMIC OINT
TOPICAL_OINTMENT | OPHTHALMIC | Status: AC
  Filled 2019-01-08: qty 3.5

## 2019-01-08 MED ORDER — FENTANYL CITRATE (PF) 100 MCG/2ML IJ SOLN
25.0000 ug | INTRAMUSCULAR | Status: DC | PRN
  Administered 2019-01-08: 11:00:00 50 ug via INTRAVENOUS
  Filled 2019-01-08: qty 1

## 2019-01-08 MED ORDER — OXYCODONE HCL 5 MG PO TABS
5.0000 mg | ORAL_TABLET | Freq: Once | ORAL | Status: AC | PRN
  Administered 2019-01-08: 5 mg via ORAL
  Filled 2019-01-08: qty 1

## 2019-01-08 MED ORDER — PROPOFOL 10 MG/ML IV BOLUS
INTRAVENOUS | Status: DC | PRN
  Administered 2019-01-08: 30 mg via INTRAVENOUS
  Administered 2019-01-08: 150 mg via INTRAVENOUS

## 2019-01-08 MED ORDER — BUPIVACAINE HCL (PF) 0.25 % IJ SOLN
INTRAMUSCULAR | Status: AC
  Filled 2019-01-08: qty 30

## 2019-01-08 MED ORDER — BUPIVACAINE HCL (PF) 0.25 % IJ SOLN
INTRAMUSCULAR | Status: DC | PRN
  Administered 2019-01-08: 20 mL

## 2019-01-08 MED ORDER — FENTANYL CITRATE (PF) 100 MCG/2ML IJ SOLN
INTRAMUSCULAR | Status: DC | PRN
  Administered 2019-01-08: 50 ug via INTRAVENOUS

## 2019-01-08 MED ORDER — CEFAZOLIN SODIUM-DEXTROSE 2-4 GM/100ML-% IV SOLN
INTRAVENOUS | Status: AC
  Filled 2019-01-08: qty 100

## 2019-01-08 MED ORDER — SODIUM CHLORIDE 0.9 % IR SOLN
Status: DC | PRN
  Administered 2019-01-08: 3000 mL

## 2019-01-08 MED ORDER — SODIUM CHLORIDE (PF) 0.9 % IJ SOLN
INTRAMUSCULAR | Status: AC
  Filled 2019-01-08: qty 50

## 2019-01-08 MED ORDER — LIDOCAINE 2% (20 MG/ML) 5 ML SYRINGE
INTRAMUSCULAR | Status: AC
  Filled 2019-01-08: qty 5

## 2019-01-08 MED ORDER — FENTANYL CITRATE (PF) 100 MCG/2ML IJ SOLN
INTRAMUSCULAR | Status: AC
  Filled 2019-01-08: qty 2

## 2019-01-08 MED ORDER — VASOPRESSIN 20 UNIT/ML IV SOLN
INTRAVENOUS | Status: AC
  Filled 2019-01-08: qty 1

## 2019-01-08 MED ORDER — MIDAZOLAM HCL 2 MG/2ML IJ SOLN
INTRAMUSCULAR | Status: DC | PRN
  Administered 2019-01-08: 2 mg via INTRAVENOUS

## 2019-01-08 MED ORDER — KETOROLAC TROMETHAMINE 30 MG/ML IJ SOLN
INTRAMUSCULAR | Status: DC | PRN
  Administered 2019-01-08: 30 mg via INTRAVENOUS

## 2019-01-08 MED ORDER — VASOPRESSIN 20 UNIT/ML IV SOLN
INTRAVENOUS | Status: DC | PRN
  Administered 2019-01-08: 20 [IU]

## 2019-01-08 MED ORDER — DEXAMETHASONE SODIUM PHOSPHATE 10 MG/ML IJ SOLN
INTRAMUSCULAR | Status: AC
  Filled 2019-01-08: qty 1

## 2019-01-08 MED ORDER — PROPOFOL 10 MG/ML IV BOLUS
INTRAVENOUS | Status: AC
  Filled 2019-01-08: qty 20

## 2019-01-08 SURGICAL SUPPLY — 14 items
ABLATOR SURESOUND NOVASURE (ABLATOR) ×3 IMPLANT
CATH ROBINSON RED A/P 16FR (CATHETERS) ×3 IMPLANT
GLOVE BIO SURGEON STRL SZ7.5 (GLOVE) ×3 IMPLANT
GLOVE BIOGEL PI IND STRL 7.0 (GLOVE) ×1 IMPLANT
GLOVE BIOGEL PI INDICATOR 7.0 (GLOVE) ×2
GOWN STRL REUS W/ TWL LRG LVL3 (GOWN DISPOSABLE) ×2 IMPLANT
GOWN STRL REUS W/TWL LRG LVL3 (GOWN DISPOSABLE) ×6
HIBICLENS CHG 4% 4OZ BTL (MISCELLANEOUS) ×3 IMPLANT
KIT PROCEDURE FLUENT (KITS) ×3 IMPLANT
PACK VAGINAL MINOR WOMEN LF (CUSTOM PROCEDURE TRAY) ×3 IMPLANT
PAD OB MATERNITY 4.3X12.25 (PERSONAL CARE ITEMS) ×3 IMPLANT
PAD PREP 24X48 CUFFED NSTRL (MISCELLANEOUS) ×3 IMPLANT
SYRINGE 1CC 25X5/8 TB ECLIPSE (MISCELLANEOUS) ×3 IMPLANT
TOWEL OR 17X24 6PK STRL BLUE (TOWEL DISPOSABLE) ×6 IMPLANT

## 2019-01-08 NOTE — Anesthesia Postprocedure Evaluation (Signed)
Anesthesia Post Note  Patient: [name]  Procedure(s) Performed: DILATATION & CURETTAGE/HYSTEROSCOPY WITH NOVASURE ABLATION (N/A Uterus)     Patient location during evaluation: PACU Anesthesia Type: General Level of consciousness: awake and alert Pain management: pain level controlled Vital Signs Assessment: post-procedure vital signs reviewed and stable Respiratory status: spontaneous breathing, nonlabored ventilation, respiratory function stable and patient connected to nasal cannula oxygen Cardiovascular status: blood pressure returned to baseline and stable Postop Assessment: no apparent nausea or vomiting Anesthetic complications: no    Last Vitals:  Vitals:   01/08/19 1048 01/08/19 1155  BP:  116/81  Pulse: 61 63  Resp: 15 13  Temp: 37 C 37.3 C  SpO2: 97% 99%    Last Pain:  Vitals:   01/08/19 1155  TempSrc:   PainSc: 3                  Ezelle Surprenant COKER

## 2019-01-08 NOTE — H&P (Signed)
[name] is an 43 y.o. female. Menorrhagia for EAB  Pertinent Gynecological History: Menses: flow is excessive with use of many pads or tampons on heaviest days Bleeding: dysfunctional uterine bleeding Contraception: none DES exposure: denies Blood transfusions: none Sexually transmitted diseases: no past history Previous GYN Procedures: DNC  Last mammogram: normal Date: 2020 Last pap: normal Date: 2020 OB History: G4, P2   Menstrual History: Menarche age: 24 Patient's last menstrual period was 12/22/2018 (approximate).    Past Medical History:  Diagnosis Date  . Menorrhagia     Past Surgical History:  Procedure Laterality Date  . DILATION AND CURETTAGE OF UTERUS  2004   w/ suction for missed ab    History reviewed. No pertinent family history.  Social History:  reports that she has never smoked. She has never used smokeless tobacco. She reports current alcohol use. She reports that she does not use drugs.  Allergies: No Known Allergies  No medications prior to admission.    Review of Systems  Constitutional: Negative.   All other systems reviewed and are negative.   Blood pressure 111/74, pulse 63, temperature 98.2 F (36.8 C), temperature source Oral, resp. rate 16, height 5\' 3"  (1.6 m), weight 55.8 kg, last menstrual period 12/22/2018. Physical Exam  Nursing note and vitals reviewed. Constitutional: She is oriented to person, place, and time. She appears well-developed and well-nourished.  Neck: Normal range of motion. Neck supple.  Cardiovascular: Normal rate and regular rhythm.  Respiratory: Effort normal and breath sounds normal.  GI: Soft. Bowel sounds are normal.  Genitourinary:    Vagina and uterus normal.   Musculoskeletal: Normal range of motion.  Neurological: She is alert and oriented to person, place, and time. She has normal reflexes.  Skin: Skin is warm and dry.  Psychiatric: She has a normal mood and affect.    No results found for  this or any previous visit (from the past 24 hour(s)).  No results found.  Assessment/Plan: Refractory Menorrhagia Diag HS, D&C, Novasure ablation Risks of anesthesia, injury to surrounding organs discussed.  Consent done.  [name] J 01/08/2019, 7:59 AM

## 2019-01-08 NOTE — Op Note (Signed)
01/08/2019  9:51 AM  PATIENT:  Kathrin Greathouse Karge  43 y.o. female  PRE-OPERATIVE DIAGNOSIS:  Menorrhagia  POST-OPERATIVE DIAGNOSIS:  Menorrhagia  PROCEDURE:  Procedure(s): DILATATION & CURETTAGE HYSTEROSCOPY WITH NOVASURE ABLATION ENDOMETRIAL POLYPECTOMY  SURGEON:  Surgeon(s): Brien Few, MD  ASSISTANTS: none   ANESTHESIA:   local and general  ESTIMATED BLOOD LOSS: minimal, Fluid deficit 23ml  DRAINS: none   LOCAL MEDICATIONS USED:  MARCAINE    and Amount: 20 ml  SPECIMEN:  Source of Specimen:  EMC AND POLYP  DISPOSITION OF SPECIMEN:  PATHOLOGY  COUNTS:  YES  DICTATION #: done  PLAN OF CARE: DC HOME  PATIENT DISPOSITION:  PACU - hemodynamically stable.

## 2019-01-08 NOTE — Progress Notes (Signed)
Patient seen and examined. Consent witnessed and signed. No changes noted. Update completed. CBC    Component Value Date/Time   WBC 8.2 01/04/2019 1457   RBC 4.28 01/04/2019 1457   HGB 12.9 01/04/2019 1457   HCT 37.9 01/04/2019 1457   PLT 313 01/04/2019 1457   MCV 88.6 01/04/2019 1457   MCH 30.1 01/04/2019 1457   MCHC 34.0 01/04/2019 1457   RDW 12.3 01/04/2019 1457

## 2019-01-08 NOTE — Anesthesia Preprocedure Evaluation (Addendum)
Anesthesia Evaluation  Patient identified by MRN, date of birth, ID band Patient awake    Reviewed: Allergy & Precautions, NPO status , Patient's Chart, lab work & pertinent test results  Airway Mallampati: II  TM Distance: >3 FB Neck ROM: Full    Dental  (+) Teeth Intact, Dental Advisory Given   Pulmonary    breath sounds clear to auscultation       Cardiovascular  Rhythm:Regular     Neuro/Psych    GI/Hepatic   Endo/Other    Renal/GU      Musculoskeletal   Abdominal   Peds  Hematology   Anesthesia Other Findings   Reproductive/Obstetrics                            Anesthesia Physical Anesthesia Plan  ASA: I  Anesthesia Plan: General   Post-op Pain Management:    Induction: Intravenous  PONV Risk Score and Plan: Ondansetron and Dexamethasone  Airway Management Planned: LMA  Additional Equipment:   Intra-op Plan:   Post-operative Plan:   Informed Consent: I have reviewed the patients History and Physical, chart, labs and discussed the procedure including the risks, benefits and alternatives for the proposed anesthesia with the patient or authorized representative who has indicated his/her understanding and acceptance.     Dental advisory given  Plan Discussed with: CRNA and Anesthesiologist  Anesthesia Plan Comments:         Anesthesia Quick Evaluation

## 2019-01-08 NOTE — Discharge Instructions (Signed)
DISCHARGE INSTRUCTIONS: D&C / D&E The following instructions have been prepared to help you care for yourself upon your return home.   Personal hygiene:  Use sanitary pads for vaginal drainage, not tampons.  Shower the day after your procedure.  NO tub baths, pools or Jacuzzis for 2-3 weeks.  Wipe front to back after using the bathroom.  Activity and limitations:  Do NOT drive or operate any equipment for 24 hours. The effects of anesthesia are still present and drowsiness may result.  Do NOT rest in bed all day.  Walking is encouraged.  Walk up and down stairs slowly.  You may resume your normal activity in one to two days or as indicated by your physician.  Sexual activity: NO intercourse for at least 2 weeks after the procedure, or as indicated by your physician.  Diet: Eat a light meal as desired this evening. You may resume your usual diet tomorrow.  Return to work: You may resume your work activities in one to two days or as indicated by your doctor.  What to expect after your surgery: Expect to have vaginal bleeding/discharge for 2-3 days and spotting for up to 10 days. It is not unusual to have soreness for up to 1-2 weeks. You may have a slight burning sensation when you urinate for the first day. Mild cramps may continue for a couple of days. You may have a regular period in 2-6 weeks.  Call your doctor for any of the following:  Excessive vaginal bleeding, saturating and changing one pad every hour.  Inability to urinate 6 hours after discharge from hospital.  Pain not relieved by pain medication.  Fever of 100.4 F or greater.  Unusual vaginal discharge or odor.   Call for an appointment:      Post Anesthesia Home Care Instructions  Activity: Get plenty of rest for the remainder of the day. A responsible adult should stay with you for 24 hours following the procedure.  For the next 24 hours, DO NOT: -Drive a car -Operate machinery -Drink alcoholic  beverages -Take any medication unless instructed by your physician -Make any legal decisions or sign important papers.  Meals: Start with liquid foods such as gelatin or soup. Progress to regular foods as tolerated. Avoid greasy, spicy, heavy foods. If nausea and/or vomiting occur, drink only clear liquids until the nausea and/or vomiting subsides. Call your physician if vomiting continues.  Special Instructions/Symptoms: Your throat may feel dry or sore from the anesthesia or the breathing tube placed in your throat during surgery. If this causes discomfort, gargle with warm salt water. The discomfort should disappear within 24 hours.  If you had a scopolamine patch placed behind your ear for the management of post- operative nausea and/or vomiting:  1. The medication in the patch is effective for 72 hours, after which it should be removed.  Wrap patch in a tissue and discard in the trash. Wash hands thoroughly with soap and water. 2. You may remove the patch earlier than 72 hours if you experience unpleasant side effects which may include dry mouth, dizziness or visual disturbances. 3. Avoid touching the patch. Wash your hands with soap and water after contact with the patch.    

## 2019-01-08 NOTE — Anesthesia Procedure Notes (Signed)
Procedure Name: LMA Insertion Date/Time: 01/08/2019 9:31 AM Performed by: Wanita Chamberlain, CRNA Pre-anesthesia Checklist: Patient identified, Emergency Drugs available, Suction available, Patient being monitored and Timeout performed Patient Re-evaluated:Patient Re-evaluated prior to induction Oxygen Delivery Method: Circle system utilized Preoxygenation: Pre-oxygenation with 100% oxygen Induction Type: IV induction Ventilation: Mask ventilation without difficulty LMA: LMA inserted LMA Size: 4.0 Number of attempts: 1 Placement Confirmation: breath sounds checked- equal and bilateral,  CO2 detector and positive ETCO2 Tube secured with: Tape Dental Injury: Teeth and Oropharynx as per pre-operative assessment

## 2019-01-08 NOTE — Transfer of Care (Signed)
Immediate Anesthesia Transfer of Care Note  Patient: [name]  Procedure(s) Performed: DILATATION & CURETTAGE/HYSTEROSCOPY WITH NOVASURE ABLATION (N/A )  Patient Location: PACU  Anesthesia Type:General  Level of Consciousness: awake, alert , oriented and patient cooperative  Airway & Oxygen Therapy: Patient Spontanous Breathing and Patient connected to nasal cannula oxygen  Post-op Assessment: Report given to RN and Post -op Vital signs reviewed and stable  Post vital signs: Reviewed and stable  Last Vitals:  Vitals Value Taken Time  BP    Temp    Pulse    Resp    SpO2      Last Pain:  Vitals:   01/08/19 0755  TempSrc:   PainSc: 0-No pain      Patients Stated Pain Goal: 5 (94/32/76 1470)  Complications: No apparent anesthesia complications

## 2019-01-08 NOTE — Op Note (Signed)
NAMEICY, FUHRMANN MEDICAL RECORD BS:[id number] ACCOUNT [id number] DATE OF BIRTH:Nov 07, 1975 FACILITY: [facility] LOCATION: [facility] PHYSICIAN:Mayan Kloepfer J. Ronita Hipps, MD  OPERATIVE REPORT  DATE OF PROCEDURE:  01/08/2019  PREOPERATIVE DIAGNOSIS:  Refractory menometrorrhagia.  POSTOPERATIVE DIAGNOSIS:  Refractory menometrorrhagia, plus multiple endometrial polyps.  PROCEDURES:  Diagnostic hysteroscopy, dilatation and curettage, endometrial polypectomy, NovaSure endometrial ablation.  SURGEON:  Brien Few, MD  ASSISTANT:  None.  ANESTHESIA:  General.  ESTIMATED BLOOD LOSS:  Less than 50 mL with fluid deficit 75 mL.  COMPLICATIONS:  None.  DRAINS:  None.  COUNTS:  Correct.  DISPOSITION:  The patient was taken to recovery in good condition.  BRIEF OPERATIVE NOTE:  After being apprised of the risks of anesthesia, infection, bleeding, and surrounding organs, possible need for repair, delayed versus immediate complications to include bowel and bladder, internal vessel injury, possible need for  repair, the patient was brought to the operating room and administered general anesthetic without complications.  Prepped and draped in usual sterile fashion, catheterized until the bladder was empty.    Exam under anesthesia revealing a bulky retroflexed uterus.  At this time, dilute Marcaine solution placed 20 mL total for a paracervical block.  Dilute Pitressin solution placed 3 and 9 o'clock at the cervicovaginal junction.  At this time, cervix  dilated up to a 23 Pratt dilator, retroverted uterus.  Hysteroscope placed.  Visualization reveals multiple posterior and left lateral wall endometrial polyps which were removed under direct visualization using endometrial polyp forceps.  A sharp  curettage was also used to collect copious amounts of endometrial tissue, which was sent to pathology.  The cavity then appeared clear.  NovaSure device was then entered and seated to a width of 4.5 and a length  of 6.5 initiated after negative CO2 test  for 69 seconds to 143 watts without complications.  Device was removed and inspected and found to be intact.  Good hemostasis was noted.  Reevaluation of the endometrial cavity reveals a clear well ablated endometrial cavity.  No evidence of perforation.     The patient tolerated the procedure well, was awakened and transferred to recovery in good condition.  AN/NUANCE  D:01/08/2019 T:01/08/2019 JOB:[id number]

## 2019-01-09 ENCOUNTER — Encounter (HOSPITAL_BASED_OUTPATIENT_CLINIC_OR_DEPARTMENT_OTHER): Payer: Self-pay | Admitting: Obstetrics and Gynecology

## 2019-02-07 DIAGNOSIS — N920 Excessive and frequent menstruation with regular cycle: Secondary | ICD-10-CM | POA: Diagnosis not present

## 2019-06-01 DIAGNOSIS — N62 Hypertrophy of breast: Secondary | ICD-10-CM | POA: Diagnosis not present

## 2019-09-14 ENCOUNTER — Ambulatory Visit: Payer: Self-pay | Attending: Internal Medicine

## 2019-09-14 DIAGNOSIS — Z23 Encounter for immunization: Secondary | ICD-10-CM | POA: Insufficient documentation

## 2019-09-14 NOTE — Progress Notes (Signed)
   Covid-19 Vaccination Clinic  Name:  [name]    MRN: [id number] DOB: 1976-07-10  09/14/2019  [name] was observed post Covid-19 immunization for 15 minutes without incidence. She was provided with Vaccine Information Sheet and instruction to access the V-Safe system.   [name] was instructed to call 911 with any severe reactions post vaccine: Marland Kitchen Difficulty breathing  . Swelling of your face and throat  . A fast heartbeat  . A bad rash all over your body  . Dizziness and weakness    Immunizations Administered    Name Date Dose VIS Date Route   Pfizer COVID-19 Vaccine 09/14/2019  9:00 AM 0.3 mL 06/29/2019 Intramuscular   Manufacturer: [employer], [employer]   Lot: [id number]   NDC: [address]-6

## 2019-10-09 ENCOUNTER — Ambulatory Visit: Payer: Self-pay | Attending: Internal Medicine

## 2019-10-09 DIAGNOSIS — Z23 Encounter for immunization: Secondary | ICD-10-CM

## 2019-10-09 NOTE — Progress Notes (Signed)
   Covid-19 Vaccination Clinic  Name:  [name]    MRN: [id number] DOB: 12/26/1975  10/09/2019  [name] was observed post Covid-19 immunization for 15 minutes without incident. She was provided with Vaccine Information Sheet and instruction to access the V-Safe system.   [name] was instructed to call 911 with any severe reactions post vaccine: Marland Kitchen Difficulty breathing  . Swelling of face and throat  . A fast heartbeat  . A bad rash all over body  . Dizziness and weakness   Immunizations Administered    Name Date Dose VIS Date Route   Pfizer COVID-19 Vaccine 10/09/2019  4:37 PM 0.3 mL 06/29/2019 Intramuscular   Manufacturer: [employer], [employer]   Lot: [id number]   NDC: [address]-8

## 2020-01-22 DIAGNOSIS — B029 Zoster without complications: Secondary | ICD-10-CM | POA: Diagnosis not present

## 2022-10-01 DIAGNOSIS — L814 Other melanin hyperpigmentation: Secondary | ICD-10-CM | POA: Diagnosis not present

## 2022-10-01 DIAGNOSIS — D2261 Melanocytic nevi of right upper limb, including shoulder: Secondary | ICD-10-CM | POA: Diagnosis not present

## 2022-10-01 DIAGNOSIS — L578 Other skin changes due to chronic exposure to nonionizing radiation: Secondary | ICD-10-CM | POA: Diagnosis not present

## 2022-10-01 DIAGNOSIS — D2272 Melanocytic nevi of left lower limb, including hip: Secondary | ICD-10-CM | POA: Diagnosis not present

## 2022-10-01 DIAGNOSIS — L820 Inflamed seborrheic keratosis: Secondary | ICD-10-CM | POA: Diagnosis not present

## 2022-12-03 DIAGNOSIS — Z01419 Encounter for gynecological examination (general) (routine) without abnormal findings: Secondary | ICD-10-CM | POA: Diagnosis not present

## 2022-12-03 DIAGNOSIS — Z124 Encounter for screening for malignant neoplasm of cervix: Secondary | ICD-10-CM | POA: Diagnosis not present

## 2022-12-03 DIAGNOSIS — Z1231 Encounter for screening mammogram for malignant neoplasm of breast: Secondary | ICD-10-CM | POA: Diagnosis not present

## 2022-12-03 DIAGNOSIS — Z13220 Encounter for screening for lipoid disorders: Secondary | ICD-10-CM | POA: Diagnosis not present

## 2022-12-22 DIAGNOSIS — Z1211 Encounter for screening for malignant neoplasm of colon: Secondary | ICD-10-CM | POA: Diagnosis not present

## 2022-12-22 DIAGNOSIS — Z1212 Encounter for screening for malignant neoplasm of rectum: Secondary | ICD-10-CM | POA: Diagnosis not present

## 2022-12-29 LAB — EXTERNAL GENERIC LAB PROCEDURE: COLOGUARD: NEGATIVE

## 2023-10-07 DIAGNOSIS — L814 Other melanin hyperpigmentation: Secondary | ICD-10-CM | POA: Diagnosis not present

## 2023-10-07 DIAGNOSIS — D225 Melanocytic nevi of trunk: Secondary | ICD-10-CM | POA: Diagnosis not present

## 2023-10-07 DIAGNOSIS — D2272 Melanocytic nevi of left lower limb, including hip: Secondary | ICD-10-CM | POA: Diagnosis not present

## 2023-10-07 DIAGNOSIS — D2261 Melanocytic nevi of right upper limb, including shoulder: Secondary | ICD-10-CM | POA: Diagnosis not present

## 2024-01-27 DIAGNOSIS — F32A Depression, unspecified: Secondary | ICD-10-CM | POA: Diagnosis not present

## 2024-01-27 DIAGNOSIS — R4586 Emotional lability: Secondary | ICD-10-CM | POA: Diagnosis not present

## 2024-01-27 DIAGNOSIS — R5383 Other fatigue: Secondary | ICD-10-CM | POA: Diagnosis not present

## 2024-01-27 DIAGNOSIS — N912 Amenorrhea, unspecified: Secondary | ICD-10-CM | POA: Diagnosis not present

## 2024-01-27 DIAGNOSIS — F419 Anxiety disorder, unspecified: Secondary | ICD-10-CM | POA: Diagnosis not present

## 2024-02-03 DIAGNOSIS — Z1331 Encounter for screening for depression: Secondary | ICD-10-CM | POA: Diagnosis not present

## 2024-02-03 DIAGNOSIS — Z1231 Encounter for screening mammogram for malignant neoplasm of breast: Secondary | ICD-10-CM | POA: Diagnosis not present

## 2024-02-03 DIAGNOSIS — R87610 Atypical squamous cells of undetermined significance on cytologic smear of cervix (ASC-US): Secondary | ICD-10-CM | POA: Diagnosis not present

## 2024-02-03 DIAGNOSIS — Z01419 Encounter for gynecological examination (general) (routine) without abnormal findings: Secondary | ICD-10-CM | POA: Diagnosis not present

## 2024-03-02 DIAGNOSIS — Z3202 Encounter for pregnancy test, result negative: Secondary | ICD-10-CM | POA: Diagnosis not present

## 2024-03-02 DIAGNOSIS — R87612 Low grade squamous intraepithelial lesion on cytologic smear of cervix (LGSIL): Secondary | ICD-10-CM | POA: Diagnosis not present

## 2024-03-02 DIAGNOSIS — R87610 Atypical squamous cells of undetermined significance on cytologic smear of cervix (ASC-US): Secondary | ICD-10-CM | POA: Diagnosis not present

## 2024-04-13 DIAGNOSIS — N951 Menopausal and female climacteric states: Secondary | ICD-10-CM | POA: Diagnosis not present

## 2024-05-25 DIAGNOSIS — N951 Menopausal and female climacteric states: Secondary | ICD-10-CM | POA: Diagnosis not present

## 2024-05-25 DIAGNOSIS — E7800 Pure hypercholesterolemia, unspecified: Secondary | ICD-10-CM | POA: Diagnosis not present
